# Patient Record
Sex: Female | Born: 1975 | Race: White | Hispanic: No | Marital: Married | Smoking: Never smoker
Health system: Southern US, Community
[De-identification: ages and names within clinical notes are randomized; demographics above are authoritative.]

## PROBLEM LIST (undated history)

## (undated) DIAGNOSIS — K589 Irritable bowel syndrome without diarrhea: Secondary | ICD-10-CM

## (undated) DIAGNOSIS — F419 Anxiety disorder, unspecified: Secondary | ICD-10-CM

## (undated) HISTORY — DX: Anxiety disorder, unspecified: F41.9

## (undated) HISTORY — DX: Irritable bowel syndrome, unspecified: K58.9

---

## 2000-03-31 ENCOUNTER — Other Ambulatory Visit: Admission: RE | Admit: 2000-03-31 | Discharge: 2000-03-31 | Payer: Self-pay | Admitting: Obstetrics and Gynecology

## 2001-04-21 ENCOUNTER — Other Ambulatory Visit: Admission: RE | Admit: 2001-04-21 | Discharge: 2001-04-21 | Payer: Self-pay | Admitting: Obstetrics and Gynecology

## 2001-09-13 ENCOUNTER — Other Ambulatory Visit: Admission: RE | Admit: 2001-09-13 | Discharge: 2001-09-13 | Payer: Self-pay | Admitting: Obstetrics and Gynecology

## 2001-09-25 ENCOUNTER — Emergency Department (HOSPITAL_COMMUNITY): Admission: EM | Admit: 2001-09-25 | Discharge: 2001-09-25 | Payer: Self-pay

## 2001-09-25 ENCOUNTER — Emergency Department (HOSPITAL_COMMUNITY): Admission: EM | Admit: 2001-09-25 | Discharge: 2001-09-25 | Payer: Self-pay | Admitting: Emergency Medicine

## 2002-04-13 ENCOUNTER — Inpatient Hospital Stay (HOSPITAL_COMMUNITY): Admission: AD | Admit: 2002-04-13 | Discharge: 2002-04-16 | Payer: Self-pay | Admitting: Obstetrics and Gynecology

## 2002-04-23 ENCOUNTER — Encounter: Admission: RE | Admit: 2002-04-23 | Discharge: 2002-05-23 | Payer: Self-pay | Admitting: Obstetrics and Gynecology

## 2002-09-04 ENCOUNTER — Other Ambulatory Visit: Admission: RE | Admit: 2002-09-04 | Discharge: 2002-09-04 | Payer: Self-pay | Admitting: Obstetrics and Gynecology

## 2003-11-02 ENCOUNTER — Other Ambulatory Visit: Admission: RE | Admit: 2003-11-02 | Discharge: 2003-11-02 | Payer: Self-pay | Admitting: Obstetrics and Gynecology

## 2004-07-14 ENCOUNTER — Inpatient Hospital Stay (HOSPITAL_COMMUNITY): Admission: AD | Admit: 2004-07-14 | Discharge: 2004-07-14 | Payer: Self-pay | Admitting: Obstetrics and Gynecology

## 2004-09-02 ENCOUNTER — Ambulatory Visit (HOSPITAL_COMMUNITY): Admission: RE | Admit: 2004-09-02 | Discharge: 2004-09-02 | Payer: Self-pay | Admitting: Obstetrics and Gynecology

## 2004-09-11 ENCOUNTER — Inpatient Hospital Stay (HOSPITAL_COMMUNITY): Admission: AD | Admit: 2004-09-11 | Discharge: 2004-09-14 | Payer: Self-pay | Admitting: Obstetrics and Gynecology

## 2006-12-18 ENCOUNTER — Encounter: Admission: RE | Admit: 2006-12-18 | Discharge: 2006-12-18 | Payer: Self-pay | Admitting: Internal Medicine

## 2007-11-18 ENCOUNTER — Inpatient Hospital Stay (HOSPITAL_COMMUNITY): Admission: AD | Admit: 2007-11-18 | Discharge: 2007-11-20 | Payer: Self-pay | Admitting: Obstetrics & Gynecology

## 2008-04-07 IMAGING — CT CT CHEST W/ CM
3 of 4 series · 17 of 30 positions shown, 19 images · IV contrast (75CC OMNI 300)
Comparison: None.

CLINICAL DATA: Right lung mass on outside chest radiograph.  Cough, fever, and chest congestion.  Question mass or infiltrate. 
 CHEST CT WITH CONTRAST:
TECHNIQUE: Multidetector CT imaging of the chest was performed following the standard protocol during bolus administration of intravenous contrast.
 Contrast:  75 cc Omnipaque 300

[Series 2: routine chest · axial · 0.62mm/px · z∈[-273,-48]mm · 5 of 77 slices shown, 7 images]
[im 16/77  mediastinal]
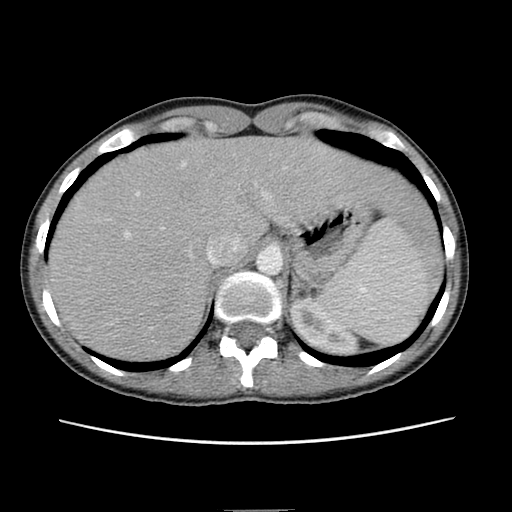
[im 16/77  lung]
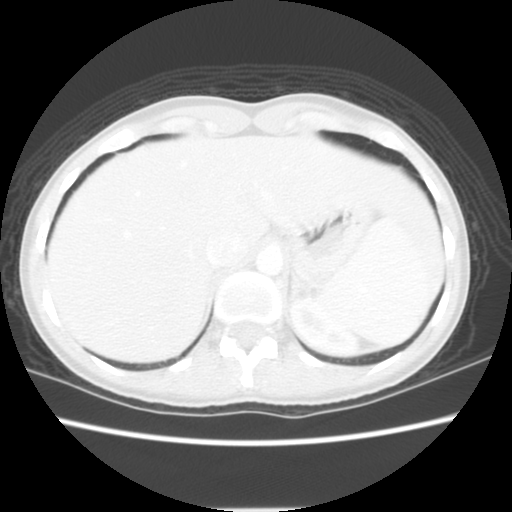
[im 31/77  lung]
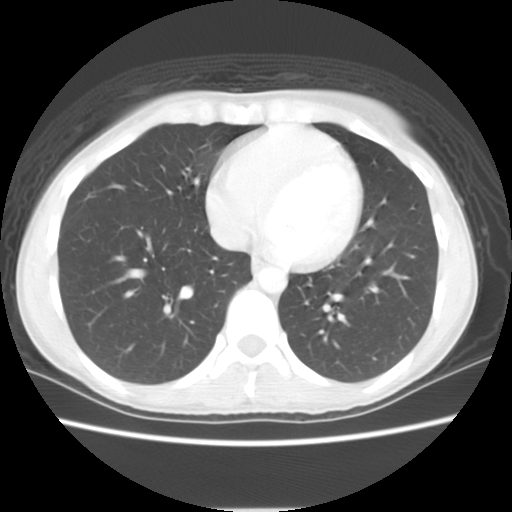
[im 38/77  lung]
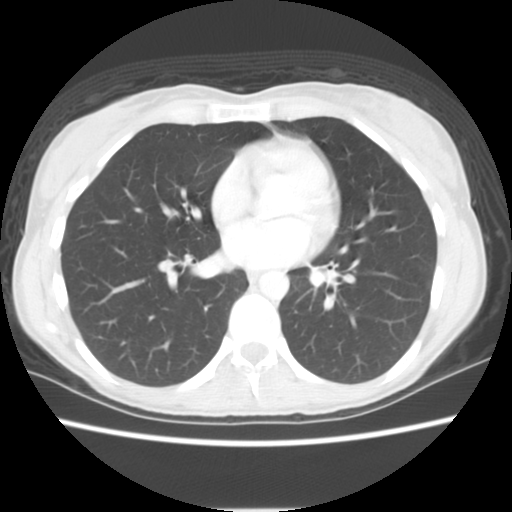
[im 46/77  lung]
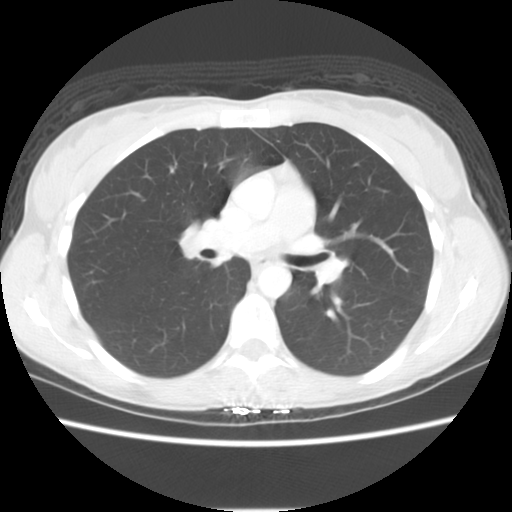
[im 61/77  mediastinal]
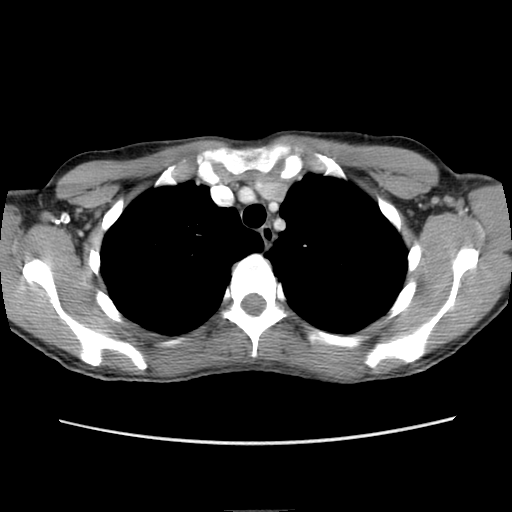
[im 61/77  lung]
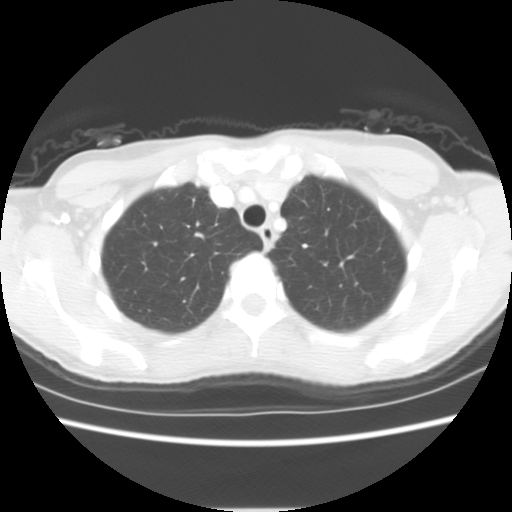

[Series 3: lung windows · axial · 0.62mm/px · z∈[-253,-43]mm · 5 of 72 slices shown]
[im 15/72  lung]
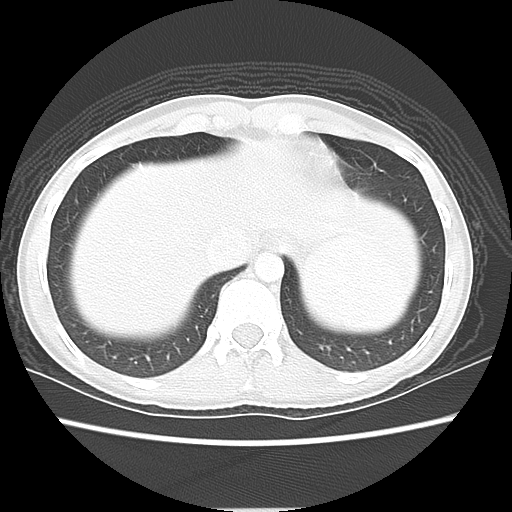
[im 29/72  lung]
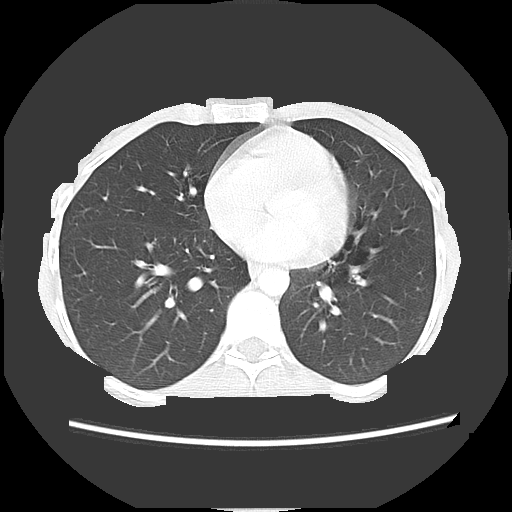
[im 33/72  lung]
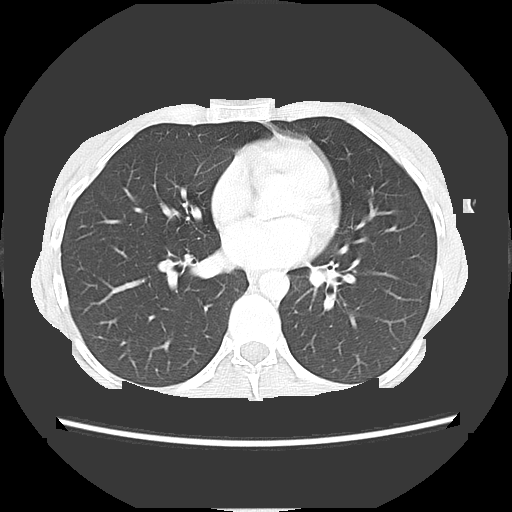
[im 43/72  lung]
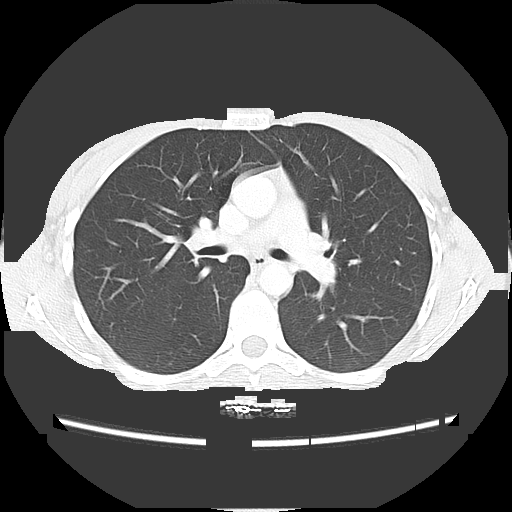
[im 57/72  lung]
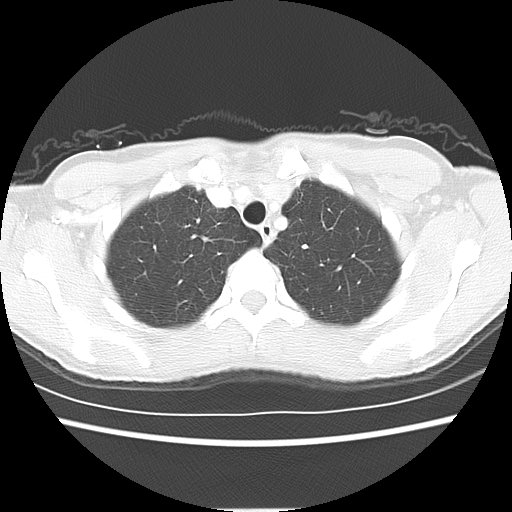

[Series 401: coronal · sagittal · 0.76mm/px · 7 of 111 slices shown]
[im 14/111  lung]
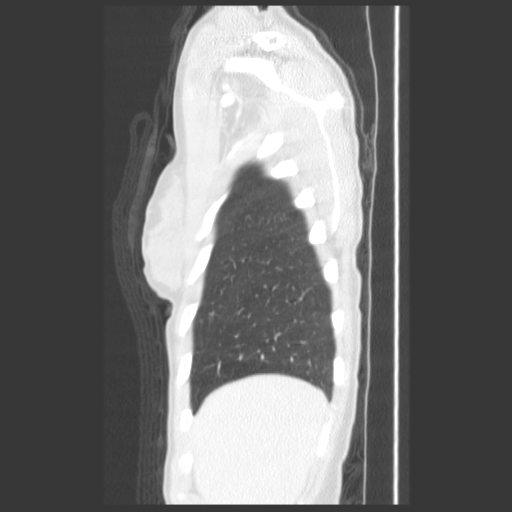
[im 28/111  lung]
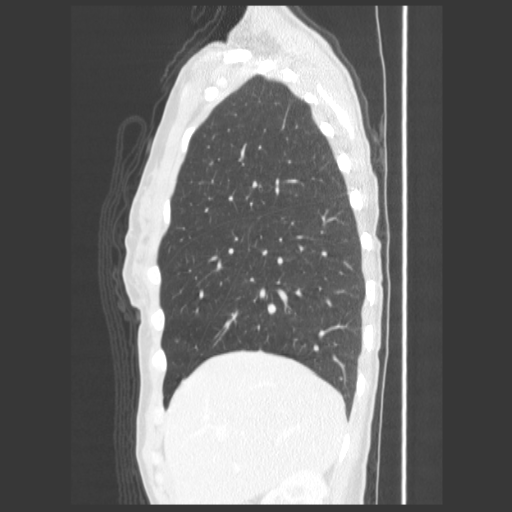
[im 42/111  lung]
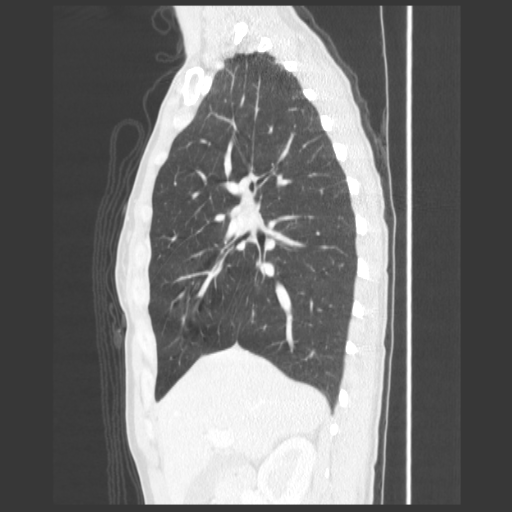
[im 56/111  lung]
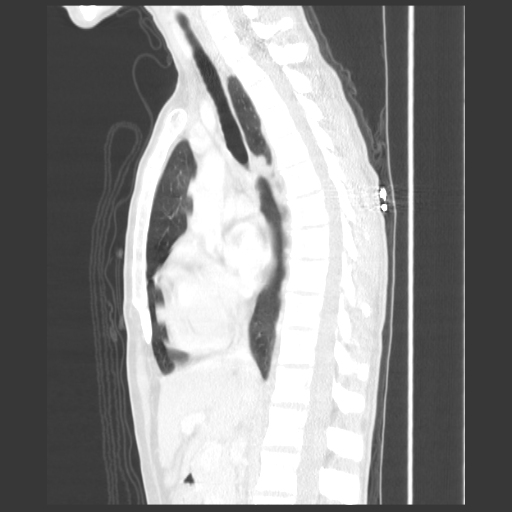
[im 69/111  lung]
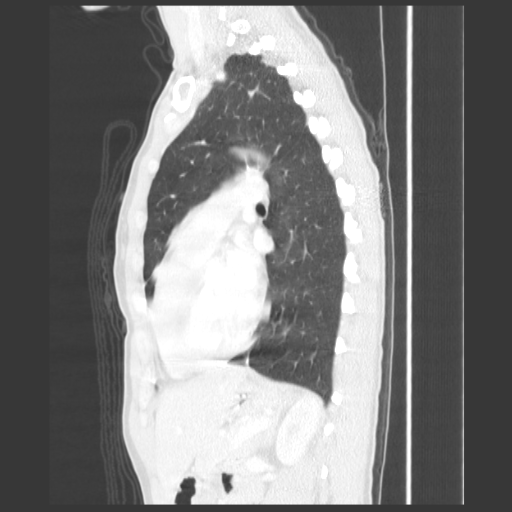
[im 83/111  lung]
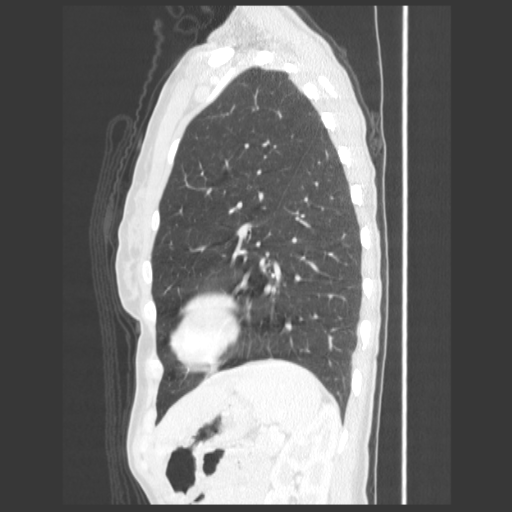
[im 97/111  lung]
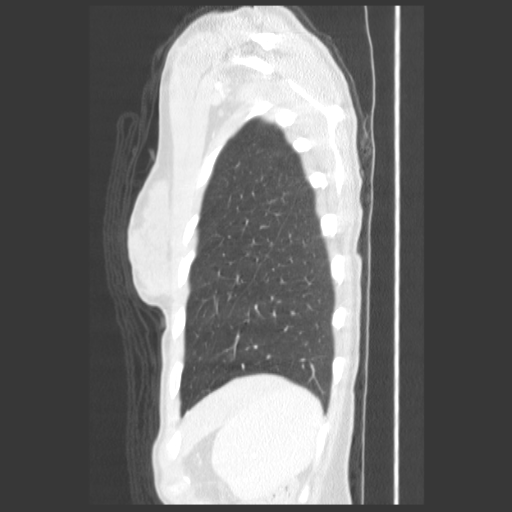

[17 of 30 positions shown; findings below may reference images not displayed]

FINDINGS: Both lungs are clear.  There is no evidence of pulmonary infiltrate or mass.  There is no evidence of pleural or pericardial effusion.  
 Heart size is normal.  There is no evidence of hilar or mediastinal masses.  No adenopathy is seen within the thorax.  There is no evidence of chest wall mass.  Images obtained through the upper abdominal structures are unremarkable.
IMPRESSION: Negative chest CT.  No evidence of mass or infiltrate.

## 2010-07-02 ENCOUNTER — Other Ambulatory Visit: Admission: RE | Admit: 2010-07-02 | Discharge: 2010-07-02 | Payer: Self-pay | Admitting: Family Medicine

## 2011-05-15 NOTE — H&P (Signed)
[facility]  Patient:    Tiffany, [name] Visit Number: [id number] MRN: [id number]          Service Type: OBS Location: 910B [id number] [id number] Attending Physician:  Michael Litter Dictated by:   Wynelle Bourgeois, CNM Admit Date:  04/13/2002                           History and Physical  HISTORY OF PRESENT ILLNESS:   This is a 35 year old G1, P0 at 37-3/7 weeks; who presents for evaluation of hypertension in pregnancy.  She reports having some uterine contractions, which are not painful.  She denies any headache or scotoma, or abdominal pain.  She has had a 7-pound weight gain this week.  CURRENT PREGNANCY HISTORY:    Her pregnancy has been remarkable for:                               1. History of irritable bowel syndrome.                               2. Mild mitral valve prolapse, for which she                                  does not require antibiotics.                               3. Group B strep negative.  OB HISTORY:                   The patient is a primigravida.  MEDICAL HISTORY:              Mitral valve prolapse, which was evaluated by a cardiologist and deemed to be mild; not requiring antibiotics.  She has a history of childhood asthma, for which she does not use medications.  She has a history of ruptured ovarian cysts.  FAMILY HISTORY:               Remarkable for paternal grandfather with heart disease.  Maternal grandmother and paternal grandmother with hypertension. Paternal grandfather with obstructive pulmonary disease.  Mother with breast cancer.  SURGICAL HISTORY:             Wisdom teeth extraction.  GENETIC HISTORY:              Unremarkable.  SOCIAL HISTORY:               The patient is married to [name], who is supportive.  She is of the [place] faith.  She denies any alcohol, tobacco or drug use.  PRENATAL LABS:                Unavailable, other than Group B strep is negative.  PHYSICAL EXAMINATION:  VITAL  SIGNS:                  Stable.  Afebrile.  HEENT:                        Within normal limits.  NECK:  Thyroid normal, not enlarged.  BREASTS:                      Soft and nontender.  CHEST:                        Clear to auscultation.  HEART:                        Regular rate and rhythm.  ABDOMEN:                      Gravid at 37 cm, vertex to [place].  EFM shows a reactive fetal heart rate with uterine contractions every 4 min (which are mild).  CERVICAL:                     3+ cm, 90% effaced and -1 station in the vertex station; with a bulging membrane.  EXTREMITIES:                  Show trace edema in her feet and none elsewhere. Deep tendon reflexes 2+.  LABORATORY VALUES:            (April 13, 2002) White blood cell count 12.1, hemoglobin 12.4,  hematocrit 35.9, platelets 219.  Urinary protein is negative.  Metabolic chemistries are within normal limits.  ASSESSMENT:                   1. Intrauterine pregnancy at 37-3/7 weeks.                               2. Prodrome delivery.                               3. Favorable cervix.                               4. Pregnancy-induced hypertension, with no                                  evidence of pre-eclampsia.  PLAN:                         1. Consult Dr. Normand Sloop.                               2. Admit to M.D. service, routine M.D. orders.                               3. Elective augmentation with Pitocin.                               4. Further orders per Dr. Normand Sloop. Dictated by:   Wynelle Bourgeois, CNM Attending Physician:  Michael Litter DD:  04/13/02 TD:  04/13/02 Job: [id number]

## 2011-05-15 NOTE — H&P (Signed)
NAMESAYDI, KOBEL                           ACCOUNT NO.:  [id number]   MEDICAL RECORD NO.:  [id number]                   PATIENT TYPE:  INP   LOCATION:  9173                                 FACILITY:  [facility]   PHYSICIAN:  Hal Morales, M.D.             DATE OF BIRTH:  02/01/1976   DATE OF ADMISSION:  09/11/2004  DATE OF DISCHARGE:                                HISTORY & PHYSICAL   HISTORY OF PRESENT ILLNESS:  [name] is a 35 year old married white female,  gravida 2, para 1-0-0-1, at 80 and 6/7ths weeks who is admitted now for  induction of labor secondary to a nonreactive NST at the office, and a  favorable cervix.  She reports mild uterine contractions.  She denies any  leaking or vaginal bleeding.  She denies any nausea, vomiting, headaches, or  visual disturbances.  Her pregnancy has been followed at [facility] by the M.D. service, and has been essentially uncomplicated, though  at risk for elevated blood pressure with her previous pregnancy.  History of  irritable bowel syndrome with no recent problems, and a history of mitral  valve prolapse, but does not require antibiotics, per her cardiologist.  Her  group B strep was positive at 28 weeks, but negative at 36 weeks; however,  the patient desires to be treated with antibiotics anyway.   OB/GYN HISTORY:  She is a gravida 2, para 1-0-0-1, who delivered a viable  female infant in April of 2003 who weighed 6 pounds, 12 ounces, at 37.[redacted] weeks  gestation.  She had a 5 hour labor without complication, and was induced for  elevated blood pressures at that time.   GENERAL MEDICAL HISTORY:  1.  She reports having had the usual childhood diseases.  2.  She has a history of childhood asthma, but no problems as an adult.  3.  History of mitral valve prolapse, but is termed mild and does not      require antibiotics, per her cardiologist.  4.  History of irritable bowel syndrome, but with no complications during  this pregnancy.   ALLERGIES:  She has no known drug allergies.   SURGICAL HISTORY:  Only for wisdom teeth.   HOSPITALIZATIONS:  Her only hospitalization has been for ovarian cysts and  childbirth.   FAMILY HISTORY:  Significant for paternal grandfather with heart disease.  Maternal grandmother and paternal grandmother with hypertension.  Mother  with breast cancer.   GENETIC HISTORY:  Negative.   SOCIAL HISTORY:  She is married to Tiffany [name], who is involved and  supportive.  They are both employed full time.  They are of the [place]  faith.  They deny any illicit drug use, alcohol, or smoking with this  pregnancy.   PRENATAL LABORATORIES:  Her blood type is B positive.  Antibody screen is  negative.  Syphilis is nonreactive.  Rubella is positive.  Hepatitis B  surface antigen is negative.  She declined HIV and cystic fibrosis.  She had  a beta strep at 28 weeks which was positive; however, a 36 week beta strep  was negative.  Her 1 hour Glucola was 142, and her 3 hour GTT was negative.   PHYSICAL EXAMINATION:  VITAL SIGNS:  Stable.  She is afebrile.  HEENT:  Grossly within normal limits.  HEART:  Regular rhythm and rate.  CHEST:  Clear.  BREASTS:  Soft and nontender.  ABDOMEN:  Gravid with uterine contractions every 2-4 minutes.  Her fetal  heart rate is 140's to 150's and not currently reactive, but no  decelerations.  PELVIC:  2 cm, 80%, vertex, and -1.  EXTREMITIES:  Within normal limits.   ASSESSMENT:  1.  Intrauterine pregnancy at term.  2.  Favorable cervix.  3.  Nonreactive NST.   PLAN:  Admit for induction of labor per Dr. Dierdre Forth, who discussed  the attendant risks and benefits of each option, which were a CST of  induction of labor, and the patient desires to proceed with induction of  labor.     Concha Pyo. Duplantis, C.N.M.              Hal Morales, M.D.    SJD/MEDQ  D:  09/11/2004  T:  09/11/2004  Job:  [id number]

## 2011-10-06 LAB — CBC
Hemoglobin: 11 — ABNORMAL LOW
Hemoglobin: 12.5
MCHC: 35.7
MCV: 95.3
RBC: 3.32 — ABNORMAL LOW
RBC: 3.72 — ABNORMAL LOW
RDW: 12
RDW: 12.3
WBC: 9.4

## 2012-04-20 ENCOUNTER — Other Ambulatory Visit: Payer: Self-pay | Admitting: Family Medicine

## 2012-04-20 ENCOUNTER — Other Ambulatory Visit (HOSPITAL_COMMUNITY)
Admission: RE | Admit: 2012-04-20 | Discharge: 2012-04-20 | Disposition: A | Discharge status: Home / Self Care | Payer: Self-pay | Source: Ambulatory Visit | Attending: Family Medicine | Admitting: Family Medicine

## 2012-04-20 DIAGNOSIS — Z01419 Encounter for gynecological examination (general) (routine) without abnormal findings: Secondary | ICD-10-CM | POA: Insufficient documentation

## 2013-03-21 ENCOUNTER — Other Ambulatory Visit: Payer: Self-pay | Admitting: Dermatology

## 2015-06-19 ENCOUNTER — Ambulatory Visit (INDEPENDENT_AMBULATORY_CARE_PROVIDER_SITE_OTHER): Payer: Self-pay | Admitting: Physician Assistant

## 2015-06-19 VITALS — BP 118/70 | HR 67 | Temp 98.5°F | Resp 14 | Ht 69.0 in | Wt 141.6 lb

## 2015-06-19 DIAGNOSIS — K589 Irritable bowel syndrome without diarrhea: Secondary | ICD-10-CM

## 2015-06-19 DIAGNOSIS — N898 Other specified noninflammatory disorders of vagina: Secondary | ICD-10-CM

## 2015-06-19 DIAGNOSIS — Z124 Encounter for screening for malignant neoplasm of cervix: Secondary | ICD-10-CM

## 2015-06-19 DIAGNOSIS — R300 Dysuria: Secondary | ICD-10-CM

## 2015-06-19 LAB — POCT WET PREP WITH KOH
CLUE CELLS WET PREP PER HPF POC: NEGATIVE
KOH Prep POC: NEGATIVE
TRICHOMONAS UA: NEGATIVE
YEAST WET PREP PER HPF POC: NEGATIVE

## 2015-06-19 LAB — POCT URINALYSIS DIPSTICK
Bilirubin, UA: NEGATIVE
Blood, UA: NEGATIVE
Glucose, UA: NEGATIVE
Ketones, UA: NEGATIVE
Nitrite, UA: NEGATIVE
PH UA: 7
PROTEIN UA: NEGATIVE
SPEC GRAV UA: 1.015
UROBILINOGEN UA: 0.2

## 2015-06-19 LAB — POCT UA - MICROSCOPIC ONLY
Bacteria, U Microscopic: NEGATIVE
CRYSTALS, UR, HPF, POC: NEGATIVE
Casts, Ur, LPF, POC: NEGATIVE
EPITHELIAL CELLS, URINE PER MICROSCOPY: NEGATIVE
Mucus, UA: NEGATIVE
RBC, urine, microscopic: NEGATIVE
YEAST UA: NEGATIVE

## 2015-06-19 MED ORDER — SERTRALINE HCL 25 MG PO TABS: 25.0000 mg | ORAL_TABLET | Freq: Every day | ORAL | Status: DC

## 2015-06-19 MED ORDER — FLUCONAZOLE 150 MG PO TABS: 150.0000 mg | ORAL_TABLET | Freq: Once | ORAL | Status: DC

## 2015-06-19 NOTE — Progress Notes (Signed)
Subjective:    Patient ID: Tiffany [name], female    DOB: 01-23-1976, 39 y.o.   MRN: [id number]  HPI Patient presents for pap exam, UTI, and vaginal discharge. Last pap and complete physical was 2 years ago and was normal. Has had white vaginal discharge since she started taking Keflex 1 week ago for UTI. Endorses vaginal itching. UTI sx are only marginally improved and endorses dysuria, decreased volume, frequency, and urgency. Denies hematuria, flank/abdominal/back pain, vaginal bleeding, dyspareunia, N/V, or fever. LMP 05/19/15 was normal and should be starting today. Sexually active with husband only . NKDA.   Request refill for sertraline for IBS. Dx with IBS 3 years ago and has been on same dose of sertraline the entire time. Dose is working. Prior to medication had both constipation and diarrhea type sx. Denies current abdominal cramping, nausea, diarrhea, constipation, or vomiting.   Review of Systems As noted above.     Objective:   Physical Exam  Constitutional: She is oriented to person, place, and time. She appears well-developed and well-nourished. No distress.  Blood pressure 118/70, pulse 67, temperature 98.5 F (36.9 C), temperature source Oral, resp. rate 14, height  (1.753 m), weight 141 lb 9.6 oz (64.229 kg), last menstrual period 05/23/2015, SpO2 98 %.   HENT:  Head: Normocephalic and atraumatic.  Right Ear: External ear normal.  Left Ear: External ear normal.  Eyes: Conjunctivae are normal. Right eye exhibits no discharge. Left eye exhibits no discharge. No scleral icterus.  Cardiovascular: Normal rate, regular rhythm and normal heart sounds.  Exam reveals no gallop.   No murmur heard. Pulmonary/Chest: Effort normal. No respiratory distress. She has no wheezes. She has no rales.  Abdominal: Soft. Bowel sounds are normal. She exhibits no distension. There is no tenderness. There is no rebound, no guarding and no CVA tenderness. No hernia. Hernia confirmed negative  in the right inguinal area.  Genitourinary: Uterus normal. No labial fusion. There is no rash, tenderness, lesion or injury on the right labia. There is no rash, tenderness, lesion or injury on the left labia. Cervix exhibits no motion tenderness, no discharge and no friability. Right adnexum displays no mass, no tenderness and no fullness. Left adnexum displays no mass, no tenderness and no fullness. There is bleeding (small amount likely start of cycle) in the vagina. No tenderness in the vagina. There is a foreign body (monistat cream present) in the vagina. No signs of injury around the vagina. No vaginal discharge found.  Lymphadenopathy:       Right: No inguinal adenopathy present.  Neurological: She is alert and oriented to person, place, and time.  Skin: She is not diaphoretic.  Psychiatric: She has a normal mood and affect. Her behavior is normal. Judgment and thought content normal.    Results for orders placed or performed in visit on 06/19/15  POCT Wet Prep with KOH  Result Value Ref Range   Trichomonas, UA Negative    Clue Cells Wet Prep HPF POC neg    Epithelial Wet Prep HPF POC Few Few, Moderate, Many   Yeast Wet Prep HPF POC neg    Bacteria Wet Prep HPF POC None (A) Few   RBC Wet Prep HPF POC none    WBC Wet Prep HPF POC none    KOH Prep POC Negative   POCT UA - Microscopic Only  Result Value Ref Range   WBC, Ur, HPF, POC 0-1    RBC, urine, microscopic neg    Bacteria,  U Microscopic neg    Mucus, UA neg    Epithelial cells, urine per micros neg    Crystals, Ur, HPF, POC neg    Casts, Ur, LPF, POC neg    Yeast, UA neg   POCT urinalysis dipstick  Result Value Ref Range   Color, UA yellow    Clarity, UA clear    Glucose, UA neg    Bilirubin, UA neg    Ketones, UA neg    Spec Grav, UA 1.015    Blood, UA neg    pH, UA 7.0    Protein, UA neg    Urobilinogen, UA 0.2    Nitrite, UA neg    Leukocytes, UA Trace (A) Negative      Assessment & Plan:  1. Screening  for cervical cancer - Pap IG, CT/NG w/ reflex HPV when ASC-U  2. Dysuria Should increase water and continue current antibiotics, however, labs are reassuring that UTI has cleared. - POCT UA - Microscopic Only - POCT urinalysis dipstick  3. Vaginal discharge Diflucan given for residual sx however, labs do not indicate current yeast infection. Monistat still on board. - POCT Wet Prep with KOH  4. IBS (irritable bowel syndrome) - sertraline (ZOLOFT) 25 MG tablet; Take 1 tablet (25 mg total) by mouth daily.  Dispense: 30 tablet; Refill: 3   Jie Stickels PA-C  [facility] 06/19/2015 3:46 PM

## 2015-06-21 LAB — PAP IG, CT-NG, RFX HPV ASCU
CHLAMYDIA PROBE AMP: NEGATIVE
GC PROBE AMP: NEGATIVE

## 2015-06-25 ENCOUNTER — Encounter: Payer: Self-pay | Admitting: Physician Assistant

## 2015-07-03 ENCOUNTER — Ambulatory Visit: Payer: Self-pay | Admitting: Physician Assistant

## 2015-08-19 ENCOUNTER — Telehealth: Payer: Self-pay

## 2015-08-19 NOTE — Telephone Encounter (Signed)
Pt states she came in as a Self pay on 6/22 and saw [employer], PA-C for Screening for cervical cancer . She sates that she received a bill from [facility] for what looks like may have been STD testing ( codes 96045 Trachomatis RNA PAP and 87591 [place]gonaria RNA PAP). She states that she did not request these, and because she was a Self pay she only wanted to have the Cervical Screening done. Please advise patient as to why these additional tests may have been ordered?   Contact [phone]

## 2015-08-22 NOTE — Telephone Encounter (Signed)
I know gonorrhea and chlamydia testing is included in the pap #3; which I believe is the one we sent off for pt. I'm not sure what we can do since Loney Loh has run the test, but Marylene Land said there is something called a "Doctor Annette Stable" we can send off. Marylene Land thought Denny Peon might know more information regarding the "Doctor Annette Stable." I will also forward message to [place].

## 2015-08-26 NOTE — Telephone Encounter (Signed)
Spoke with Tiffany [name] and patient's GC lab will be doctor billed.

## 2015-08-27 NOTE — Telephone Encounter (Signed)
Done

## 2015-10-10 ENCOUNTER — Other Ambulatory Visit: Payer: Self-pay

## 2015-10-10 DIAGNOSIS — K589 Irritable bowel syndrome without diarrhea: Secondary | ICD-10-CM

## 2015-10-10 NOTE — Telephone Encounter (Signed)
Patient want to know if she can get a refill for setraline or if she needs another ov in order to get her medication. Please advise patient! [phone]

## 2015-10-10 NOTE — Telephone Encounter (Signed)
She was seen on 05/2015 for this, can she get refill of Sertraline ? Pended

## 2015-10-14 ENCOUNTER — Telehealth: Payer: Self-pay

## 2015-10-14 DIAGNOSIS — K589 Irritable bowel syndrome without diarrhea: Secondary | ICD-10-CM

## 2015-10-14 MED ORDER — SERTRALINE HCL 25 MG PO TABS: 25.0000 mg | ORAL_TABLET | Freq: Every day | ORAL | Status: DC

## 2015-10-14 NOTE — Telephone Encounter (Signed)
I'm not sure why zoloft was denied... She has been on same dose for 3 years for IBS. I gave another 4 months worth. When she runs out she will need to come in for f/u.

## 2015-10-14 NOTE — Telephone Encounter (Signed)
This was refused in previous message, this stands correct or can we sent in partial refill until pt can come in?

## 2015-10-14 NOTE — Telephone Encounter (Signed)
Pt states she had called last week regarding a refill on her ZOLOFT 10 MG. Please call [phone]    [facility]

## 2015-10-15 NOTE — Telephone Encounter (Signed)
Left message Rx was sent in

## 2016-02-06 ENCOUNTER — Other Ambulatory Visit: Payer: Self-pay | Admitting: Physician Assistant

## 2016-03-17 ENCOUNTER — Other Ambulatory Visit: Payer: Self-pay | Admitting: Physician Assistant

## 2017-03-02 ENCOUNTER — Ambulatory Visit: Payer: Self-pay | Admitting: Family Medicine

## 2017-03-15 ENCOUNTER — Encounter: Payer: Self-pay | Admitting: Family Medicine

## 2017-03-15 ENCOUNTER — Ambulatory Visit (INDEPENDENT_AMBULATORY_CARE_PROVIDER_SITE_OTHER): Payer: PRIVATE HEALTH INSURANCE | Admitting: Family Medicine

## 2017-03-15 VITALS — BP 110/70 | HR 67 | Resp 12 | Ht 69.0 in | Wt 145.0 lb

## 2017-03-15 DIAGNOSIS — K582 Mixed irritable bowel syndrome: Secondary | ICD-10-CM

## 2017-03-15 DIAGNOSIS — F419 Anxiety disorder, unspecified: Secondary | ICD-10-CM | POA: Diagnosis not present

## 2017-03-15 DIAGNOSIS — N6019 Diffuse cystic mastopathy of unspecified breast: Secondary | ICD-10-CM

## 2017-03-15 DIAGNOSIS — K589 Irritable bowel syndrome without diarrhea: Secondary | ICD-10-CM | POA: Insufficient documentation

## 2017-03-15 DIAGNOSIS — Z0000 Encounter for general adult medical examination without abnormal findings: Secondary | ICD-10-CM | POA: Diagnosis not present

## 2017-03-15 MED ORDER — SERTRALINE HCL 25 MG PO TABS: 25.0000 mg | ORAL_TABLET | Freq: Every day | ORAL | 2 refills | Status: DC

## 2017-03-15 MED ORDER — SERTRALINE HCL 25 MG PO TABS: 25.0000 mg | ORAL_TABLET | Freq: Every day | ORAL | 3 refills | Status: DC

## 2017-03-15 NOTE — Patient Instructions (Signed)
A few things to remember from today's visit:   Irritable bowel syndrome with both constipation and diarrhea - Plan: sertraline (ZOLOFT) 25 MG tablet, DISCONTINUED: sertraline (ZOLOFT) 25 MG tablet    Please be sure medication list is accurate. If a new problem present, please set up appointment sooner than planned today.

## 2017-03-15 NOTE — Progress Notes (Signed)
HPI:   [name] is a 41 y.o. female, who is here today to establish care with me.  Former PCP: Dr Wynelle Link, [facility] Last preventive routine visit: 2017, last pap smear 2016.  Chronic medical problems: IBS, mild anxiety, UTI's (2 times per year), and fibrocystic breast disease.   Concerns today: Refills on Zoloft and breast exam.  She has had on Zoloft 25 mg daily for 4 years, started to treat IBS. She has some underlying anxiety and medication has also helped this this. She denies depression or suicidal thoughts.  Medication has helped tremendously with IBS symptoms (diarrhea and constipation) ,she has had about 2-3 episodes since medication was started. She tried to discontinue it once and "did not feel good."  She denies side effects.  Insomnia/sleep disorder: Denies  -Breast "fullness" sensation, which she has ad for years and unchanged.  Mammogram in 09/2016,  Fullness sensation/"lumps" with menses,starts a few days before and alleviated after menstrual period. She denies any change or new associated symptom. Breast soreness, bilateral. Also aggravated by caffeine intake.  No nipple discharge or skin changes.  Mother breast cancer at 56 yo and her best friend just Dx with breast cancer.  Last LMP 02/21/17. She is not on OCP's. Has had mammograms since age 28.   Review of Systems  Constitutional: Negative for activity change, appetite change, fatigue, fever and unexpected weight change.  HENT: Negative for mouth sores, nosebleeds and trouble swallowing.   Eyes: Negative for redness and visual disturbance.  Respiratory: Negative for cough, shortness of breath and wheezing.   Cardiovascular: Negative for chest pain, palpitations and leg swelling.  Gastrointestinal: Negative for abdominal pain, nausea and vomiting.       Negative for changes in bowel habits.  Endocrine: Negative for cold intolerance and heat intolerance.  Genitourinary:  Negative for decreased urine volume, dysuria, hematuria and menstrual problem.  Musculoskeletal: Negative for back pain and myalgias.  Skin: Negative for color change and rash.  Neurological: Negative for syncope, weakness and headaches.  Psychiatric/Behavioral: Negative for confusion and suicidal ideas. The patient is nervous/anxious.       No current outpatient prescriptions on file prior to visit.   No current facility-administered medications on file prior to visit.      Past Medical History:  Diagnosis Date  . Anxiety   . IBS (irritable bowel syndrome)    No Known Allergies  Family History  Problem Relation Age of Onset  . Cancer Mother     breast  . Heart disease Father   . Hyperlipidemia Father   . Cancer Maternal Grandmother   . Cancer Paternal Grandmother     Social History   Social History  . Marital status: Married    Spouse name: N/A  . Number of children: N/A  . Years of education: N/A   Social History Main Topics  . Smoking status: Never Smoker  . Smokeless tobacco: Never Used  . Alcohol use 4.2 - 8.4 oz/week    7 - 14 Glasses of wine per week     Comment: 1-2 glasses if wine daily  . Drug use: No  . Sexual activity: Yes   Other Topics Concern  . None   Social History Narrative  . None    Vitals:   03/15/17 1354  BP: 110/70  Pulse: 67  Resp: 12   O2 sat 99% at RA. Body mass index is 21.41 kg/m.   Physical Exam  Nursing note and vitals  reviewed. Constitutional: She is oriented to person, place, and time. She appears well-developed and well-nourished. No distress.  HENT:  Head: Atraumatic.  Mouth/Throat: Oropharynx is clear and moist and mucous membranes are normal.  Eyes: Conjunctivae and EOM are normal. Pupils are equal, round, and reactive to light.  Neck: No tracheal deviation present. No thyroid mass and no thyromegaly present.  Cardiovascular: Normal rate and regular rhythm.   No murmur heard. Pulses:      Dorsalis pedis  pulses are 2+ on the right side, and 2+ on the left side.  Respiratory: Effort normal and breath sounds normal. No respiratory distress.  GI: Soft. She exhibits no mass. There is no hepatomegaly. There is no tenderness.  Genitourinary: No breast swelling or bleeding.  Genitourinary Comments: Breast: fibrocystic like changes bilateral, L>R.Outer and upper quadrants mainly.No nipple discharge.  Musculoskeletal: She exhibits no edema.  Lymphadenopathy:    She has no cervical adenopathy.    She has no axillary adenopathy.       Right: No supraclavicular adenopathy present.       Left: No supraclavicular adenopathy present.  Neurological: She is alert and oriented to person, place, and time. She has normal strength. Coordination and gait normal.  Skin: Skin is warm. No rash noted. No erythema.  Psychiatric: Her mood appears anxious.  Well groomed, good eye contact.      ASSESSMENT AND PLAN:   Tiffany [name] was seen today for establish care.  Diagnoses and all orders for this visit:  Irritable bowel syndrome with both constipation and diarrhea  Well controlled. No changes in current management. Some side effects discussed. F/U in 12 months.  -     sertraline (ZOLOFT) 25 MG tablet; Take 1 tablet (25 mg total) by mouth daily.  Fibrocystic breast disease (FCBD) in female, unspecified laterality  Symptoms and treatment recommendations reviewed,including decreasing caffeine and alcohol intake and Vit E OTC. We discussed current recommendations about breast self-exam.  Reporting no new symptoms and mammogram 09/2016. Instructed about warning signs.  Anxiety  Mild. No changes in Zoloft. F/U in a year,before if needed.   Healthcare maintenance  Recommendations in regard to pap smears and mammograms discussed as well as lipid and diabetes screening. At this time I do not have records from Dr Chase CallerSun's office.      Tiffany [name] G. [place], MD  [facility]. [place]  office.

## 2017-03-15 NOTE — Progress Notes (Signed)
Pre visit review using our clinic review tool, if applicable. No additional management support is needed unless otherwise documented below in the visit note. 

## 2017-08-24 LAB — HM MAMMOGRAPHY

## 2017-08-25 ENCOUNTER — Encounter: Payer: Self-pay | Admitting: Family Medicine

## 2018-04-19 ENCOUNTER — Other Ambulatory Visit: Payer: Self-pay | Admitting: Family Medicine

## 2018-04-19 DIAGNOSIS — K582 Mixed irritable bowel syndrome: Secondary | ICD-10-CM

## 2018-08-22 LAB — HM MAMMOGRAPHY

## 2018-08-26 ENCOUNTER — Encounter: Payer: Self-pay | Admitting: Family Medicine

## 2019-03-20 ENCOUNTER — Other Ambulatory Visit: Payer: Self-pay | Admitting: Family Medicine

## 2019-03-20 DIAGNOSIS — K582 Mixed irritable bowel syndrome: Secondary | ICD-10-CM

## 2019-07-10 ENCOUNTER — Other Ambulatory Visit: Payer: Self-pay | Admitting: Family Medicine

## 2019-07-10 DIAGNOSIS — K582 Mixed irritable bowel syndrome: Secondary | ICD-10-CM

## 2019-07-12 ENCOUNTER — Ambulatory Visit (INDEPENDENT_AMBULATORY_CARE_PROVIDER_SITE_OTHER): Payer: PRIVATE HEALTH INSURANCE | Admitting: Family Medicine

## 2019-07-12 ENCOUNTER — Other Ambulatory Visit: Payer: Self-pay

## 2019-07-12 ENCOUNTER — Other Ambulatory Visit: Payer: Self-pay | Admitting: Family Medicine

## 2019-07-12 ENCOUNTER — Encounter: Payer: Self-pay | Admitting: Family Medicine

## 2019-07-12 DIAGNOSIS — K582 Mixed irritable bowel syndrome: Secondary | ICD-10-CM

## 2019-07-12 DIAGNOSIS — F419 Anxiety disorder, unspecified: Secondary | ICD-10-CM

## 2019-07-12 NOTE — Progress Notes (Signed)
Virtual Visit via Video Note   I connected with [name] on 07/12/19 at  4:30 PM EDT by a video enabled telemedicine application and verified that I am speaking with the correct person using two identifiers.  Location patient: home Location provider:work or home office Persons participating in the virtual visit: patient, provider  I discussed the limitations of evaluation and management by telemedicine and the availability of in person appointments. The patient expressed understanding and agreed to proceed.   HPI: [name] is a 43 yo female with Hx of anxiety and IBS, requesting refills on Sertraline. She was last seen on 03/15/2017,when she established care. Somehow she has received refills from the office since her last OV.  She has been on Sertraline 25 mg for about 6-7 years for Anxiety and IBS. Medication has helped with IBS-diarrhea-constipation. She has tried to stop medication but anxiety and IBS symptoms re-occurred.  Intermittent episodes of diarrhea and abdominal cramps. Problem is exacerbated by stress.  She has tolerated medication well.  She also wants to know if she is up to date with her pap smear and other healthcare preventive testing for her age.   ROS: See pertinent positives and negatives per HPI.  Past Medical History:  Diagnosis Date  . Anxiety   . IBS (irritable bowel syndrome)     No past surgical history on file.  Family History  Problem Relation Age of Onset  . Cancer Mother        breast  . Heart disease Father   . Hyperlipidemia Father   . Cancer Maternal Grandmother   . Cancer Paternal Grandmother     Social History   Socioeconomic History  . Marital status: Married    Spouse name: Not on file  . Number of children: Not on file  . Years of education: Not on file  . Highest education level: Not on file  Occupational History  . Not on file  Social Needs  . Financial resource strain: Not on file  . Food insecurity    Worry: Not on  file    Inability: Not on file  . Transportation needs    Medical: Not on file    Non-medical: Not on file  Tobacco Use  . Smoking status: Never Smoker  . Smokeless tobacco: Never Used  Substance and Sexual Activity  . Alcohol use: Yes    Alcohol/week: 7.0 - 14.0 standard drinks    Types: 7 - 14 Glasses of wine per week    Comment: 1-2 glasses if wine daily  . Drug use: No  . Sexual activity: Yes  Lifestyle  . Physical activity    Days per week: Not on file    Minutes per session: Not on file  . Stress: Not on file  Relationships  . Social Musicianconnections    Talks on phone: Not on file    Gets together: Not on file    Attends religious service: Not on file    Active member of club or organization: Not on file    Attends meetings of clubs or organizations: Not on file    Relationship status: Not on file  . Intimate partner violence    Fear of current or ex partner: Not on file    Emotionally abused: Not on file    Physically abused: Not on file    Forced sexual activity: Not on file  Other Topics Concern  . Not on file  Social History Narrative  . Not on file  Current Outpatient Medications:  .  sertraline (ZOLOFT) 25 MG tablet, TAKE 1 TABLET BY MOUTH EVERY DAY, Disp: 90 tablet, Rfl: 3  EXAM:  VITALS per patient if applicable:LMP 56/81/2751   GENERAL: alert, oriented, appears well and in no acute distress  HEENT: atraumatic, conjunctiva clear, no obvious facial abnormalities on inspection.  LUNGS: on inspection no signs of respiratory distress, breathing rate appears normal, no obvious gross SOB, gasping or wheezing  CV: no obvious cyanosis  MS: moves all visible extremities without noticeable abnormality  PSYCH/NEURO: pleasant and cooperative, no obvious depression or anxiety, speech and thought processing grossly intact  ASSESSMENT AND PLAN:  Discussed the following assessment and plan:  IBS (irritable bowel syndrome) Problem is well controlled. No  changes in current management. Follow up in a year ,before if needed.   Anxiety Problem is stable and well controlled. Continue Sertraline 25 mg daily. Instructed about warning signs. F/U in a year.   15 min face to face OV. > 50% was dedicated to discussion of Dx, prognosis, and some side effects of medications. Last pap smear in 05/2015, negative.HPV screening was not done. Mammogram in 07/2018, Bi-Rads 2. Recommended arranging CPE.  I discussed the assessment and treatment plan with the patient. She was provided an opportunity to ask questions and all were answered. She agreed with the plan and demonstrated an understanding of the instructions.     Return in about 1 year (around 07/11/2020) for follow up.    Shareta Fishbaugh [place], MD

## 2019-07-12 NOTE — Assessment & Plan Note (Signed)
Problem is stable and well controlled. Continue Sertraline 25 mg daily. Instructed about warning signs. F/U in a year.

## 2019-07-12 NOTE — Assessment & Plan Note (Signed)
Problem is well controlled. No changes in current management. Follow up in a year ,before if needed.

## 2019-07-12 NOTE — Telephone Encounter (Signed)
Left message for patient to schedule virtual visit with Dr. Martin[place] on 07/13/2019 for medication refill.  Last ov 2018.

## 2019-08-28 LAB — HM MAMMOGRAPHY

## 2019-08-29 ENCOUNTER — Encounter: Payer: Self-pay | Admitting: Family Medicine

## 2020-06-17 ENCOUNTER — Other Ambulatory Visit: Payer: Self-pay | Admitting: Family Medicine

## 2020-06-17 DIAGNOSIS — K582 Mixed irritable bowel syndrome: Secondary | ICD-10-CM

## 2020-10-22 ENCOUNTER — Other Ambulatory Visit: Payer: Self-pay | Admitting: Family Medicine

## 2020-10-22 DIAGNOSIS — K582 Mixed irritable bowel syndrome: Secondary | ICD-10-CM

## 2023-12-14 ENCOUNTER — Encounter: Payer: Self-pay | Admitting: Internal Medicine

## 2024-01-27 ENCOUNTER — Telehealth: Payer: Self-pay | Admitting: *Deleted

## 2024-01-27 NOTE — Telephone Encounter (Signed)
Attempt to reach pt for pre-visit. LM with call back #.  Will attempt to reach again in 5 min due to no other # listed in profile  Second attempt to reach pt for pre-vist unsuccessful. LM with facility # for pt to call back. Instructed pt to call # given by end of the day and reschedule the pre-visit  with RN or the scheduled procedure will be canceled.

## 2024-02-03 ENCOUNTER — Encounter: Payer: Self-pay | Admitting: Internal Medicine
